# Patient Record
Sex: Female | Born: 1995 | Hispanic: Yes | Marital: Single | Smoking: Former smoker
Health system: Southern US, Community
[De-identification: ages and names within clinical notes are randomized; demographics above are authoritative.]

---
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
## 2019-07-16 ENCOUNTER — Ambulatory Visit: Payer: Self-pay

## 2019-08-15 ENCOUNTER — Other Ambulatory Visit: Payer: Self-pay

## 2019-08-15 DIAGNOSIS — Z20822 Contact with and (suspected) exposure to covid-19: Secondary | ICD-10-CM

## 2019-08-17 LAB — NOVEL CORONAVIRUS, NAA: SARS-CoV-2, NAA: NOT DETECTED

## 2019-08-18 ENCOUNTER — Ambulatory Visit (LOCAL_COMMUNITY_HEALTH_CENTER): Payer: Self-pay

## 2019-08-18 ENCOUNTER — Other Ambulatory Visit: Payer: Self-pay

## 2019-08-18 ENCOUNTER — Ambulatory Visit: Payer: Self-pay | Admitting: Advanced Practice Midwife

## 2019-08-18 DIAGNOSIS — F419 Anxiety disorder, unspecified: Secondary | ICD-10-CM

## 2019-08-18 DIAGNOSIS — Z62810 Personal history of physical and sexual abuse in childhood: Secondary | ICD-10-CM

## 2019-08-18 DIAGNOSIS — F325 Major depressive disorder, single episode, in full remission: Secondary | ICD-10-CM | POA: Insufficient documentation

## 2019-08-18 DIAGNOSIS — F4310 Post-traumatic stress disorder, unspecified: Secondary | ICD-10-CM

## 2019-08-18 DIAGNOSIS — F1290 Cannabis use, unspecified, uncomplicated: Secondary | ICD-10-CM

## 2019-08-18 DIAGNOSIS — Z113 Encounter for screening for infections with a predominantly sexual mode of transmission: Secondary | ICD-10-CM

## 2019-08-18 DIAGNOSIS — Z23 Encounter for immunization: Secondary | ICD-10-CM

## 2019-08-18 DIAGNOSIS — F1411 Cocaine abuse, in remission: Secondary | ICD-10-CM

## 2019-08-18 LAB — WET PREP FOR TRICH, YEAST, CLUE
Trichomonas Exam: NEGATIVE
Yeast Exam: NEGATIVE
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
## 2019-08-18 NOTE — Progress Notes (Signed)
Wet mount reviewed, no tx per standing order. Influenza vaccine administered. Provider orders completed.

## 2019-08-26 ENCOUNTER — Ambulatory Visit: Payer: Self-pay

## 2019-08-26 ENCOUNTER — Other Ambulatory Visit: Payer: Self-pay

## 2019-08-26 ENCOUNTER — Telehealth: Payer: Self-pay

## 2019-08-26 DIAGNOSIS — A749 Chlamydial infection, unspecified: Secondary | ICD-10-CM

## 2019-08-26 NOTE — Telephone Encounter (Signed)
TC to patient. Verified ID via password/SS#. Informed of positive Chlamydia and need for tx. Instructed to eat before visit and have partner call for tx appt. Appt scheduled. Zeno Hickel, RN   

## 2019-08-27 DIAGNOSIS — A749 Chlamydial infection, unspecified: Secondary | ICD-10-CM

## 2019-08-27 MED ORDER — AZITHROMYCIN 500 MG PO TABS
1000.0000 mg | ORAL_TABLET | Freq: Once | ORAL | Status: AC
  Administered 2019-08-27: 09:00:00 1000 mg via ORAL

## 2019-08-27 NOTE — Progress Notes (Signed)
Patient tx'd for Chlamydia per SO. Aileen Fass, RN

## 2019-09-25 ENCOUNTER — Ambulatory Visit: Payer: Self-pay | Admitting: Family Medicine

## 2019-09-25 ENCOUNTER — Other Ambulatory Visit: Payer: Self-pay

## 2019-09-25 ENCOUNTER — Encounter: Payer: Self-pay | Admitting: Family Medicine

## 2019-09-25 DIAGNOSIS — R300 Dysuria: Secondary | ICD-10-CM

## 2019-09-25 DIAGNOSIS — Z113 Encounter for screening for infections with a predominantly sexual mode of transmission: Secondary | ICD-10-CM

## 2019-09-25 LAB — WET PREP FOR TRICH, YEAST, CLUE
Trichomonas Exam: NEGATIVE
Yeast Exam: NEGATIVE

## 2019-09-25 NOTE — Progress Notes (Signed)
Wet mount reviewed and is negative today, so no treatment needed for wet mount per standing order. PCP list given to pt. Counseled pt per provider orders and pt states understanding. Provider orders completed.Ronny Bacon, RN
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
## 2019-09-25 NOTE — Progress Notes (Signed)
Pt here for STD screening today. Pt reports she feels like she has a UTI.Ronny Bacon, RN

## 2019-10-01 LAB — HEPATITIS B SURFACE ANTIGEN

## 2019-10-08 ENCOUNTER — Ambulatory Visit: Payer: Self-pay

## 2019-10-08 LAB — HM HEPATITIS C SCREENING LAB: HM Hepatitis Screen: NEGATIVE

## 2019-10-08 LAB — HM HIV SCREENING LAB: HM HIV Screening: NEGATIVE

## 2019-10-17 ENCOUNTER — Ambulatory Visit: Payer: Self-pay

## 2020-01-16 ENCOUNTER — Other Ambulatory Visit: Payer: Self-pay

## 2020-01-16 ENCOUNTER — Encounter: Payer: Self-pay | Admitting: Physician Assistant

## 2020-01-16 ENCOUNTER — Ambulatory Visit: Payer: Self-pay | Admitting: Physician Assistant

## 2020-01-16 DIAGNOSIS — Z1388 Encounter for screening for disorder due to exposure to contaminants: Secondary | ICD-10-CM | POA: Diagnosis not present

## 2020-01-16 DIAGNOSIS — Z113 Encounter for screening for infections with a predominantly sexual mode of transmission: Secondary | ICD-10-CM

## 2020-01-16 DIAGNOSIS — Z0389 Encounter for observation for other suspected diseases and conditions ruled out: Secondary | ICD-10-CM | POA: Diagnosis not present

## 2020-01-16 DIAGNOSIS — Z3009 Encounter for other general counseling and advice on contraception: Secondary | ICD-10-CM | POA: Diagnosis not present

## 2020-01-16 LAB — WET PREP FOR TRICH, YEAST, CLUE
Trichomonas Exam: NEGATIVE
Yeast Exam: NEGATIVE
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
## 2020-01-20 LAB — GONOCOCCUS CULTURE

## 2020-03-17 ENCOUNTER — Ambulatory Visit: Payer: Medicaid Other

## 2020-05-12 ENCOUNTER — Ambulatory Visit
Admission: EM | Admit: 2020-05-12 | Discharge: 2020-05-12 | Disposition: A | Discharge status: Other Acute Inpatient Hospital | Payer: Self-pay

## 2020-06-21 ENCOUNTER — Emergency Department

## 2020-06-21 ENCOUNTER — Other Ambulatory Visit: Payer: Self-pay

## 2020-06-21 ENCOUNTER — Emergency Department
Admission: EM | Admit: 2020-06-21 | Discharge: 2020-06-21 | Disposition: A | Discharge status: Home / Self Care | Attending: Emergency Medicine | Admitting: Emergency Medicine

## 2020-06-21 ENCOUNTER — Encounter: Payer: Self-pay | Admitting: Emergency Medicine

## 2020-06-21 DIAGNOSIS — Y92512 Supermarket, store or market as the place of occurrence of the external cause: Secondary | ICD-10-CM | POA: Diagnosis not present

## 2020-06-21 DIAGNOSIS — W010XXA Fall on same level from slipping, tripping and stumbling without subsequent striking against object, initial encounter: Secondary | ICD-10-CM | POA: Insufficient documentation

## 2020-06-21 DIAGNOSIS — Y990 Civilian activity done for income or pay: Secondary | ICD-10-CM | POA: Diagnosis not present

## 2020-06-21 DIAGNOSIS — S42432A Displaced fracture (avulsion) of lateral epicondyle of left humerus, initial encounter for closed fracture: Secondary | ICD-10-CM | POA: Diagnosis not present

## 2020-06-21 DIAGNOSIS — Y9389 Activity, other specified: Secondary | ICD-10-CM | POA: Insufficient documentation

## 2020-06-21 DIAGNOSIS — W19XXXA Unspecified fall, initial encounter: Secondary | ICD-10-CM

## 2020-06-21 DIAGNOSIS — S59902A Unspecified injury of left elbow, initial encounter: Secondary | ICD-10-CM | POA: Diagnosis present

## 2020-06-21 DIAGNOSIS — Y999 Unspecified external cause status: Secondary | ICD-10-CM | POA: Diagnosis not present

## 2020-06-21 DIAGNOSIS — S42402A Unspecified fracture of lower end of left humerus, initial encounter for closed fracture: Secondary | ICD-10-CM

## 2020-06-21 LAB — POCT PREGNANCY, URINE: Preg Test, Ur: NEGATIVE

## 2020-06-21 MED ORDER — FENTANYL CITRATE (PF) 100 MCG/2ML IJ SOLN: 50.0000 ug | Freq: Once | INTRAMUSCULAR | Status: DC

## 2020-06-21 MED ORDER — OXYCODONE-ACETAMINOPHEN 5-325 MG PO TABS
1.0000 | ORAL_TABLET | Freq: Once | ORAL | Status: AC
  Administered 2020-06-21: 1 via ORAL
  Filled 2020-06-21: qty 1

## 2020-06-21 MED ORDER — OXYCODONE HCL 5 MG PO TABS: 5.0000 mg | ORAL_TABLET | Freq: Four times a day (QID) | ORAL | 0 refills | Status: AC | PRN

## 2020-06-21 MED ORDER — ONDANSETRON HCL 4 MG/2ML IJ SOLN: 4.0000 mg | Freq: Once | INTRAMUSCULAR | Status: DC

## 2020-06-21 MED ORDER — ACETAMINOPHEN 325 MG PO TABS
650.0000 mg | ORAL_TABLET | Freq: Once | ORAL | Status: AC
  Administered 2020-06-21: 650 mg via ORAL
  Filled 2020-06-21: qty 2

## 2020-06-21 MED ORDER — ONDANSETRON 4 MG PO TBDP: 4.0000 mg | ORAL_TABLET | Freq: Three times a day (TID) | ORAL | 0 refills | Status: DC | PRN

## 2020-06-21 NOTE — ED Triage Notes (Signed)
Pt in via EMS from her place of employment. Pt went to put something on a pallet at work, tripped and fell forward and then fell backwards. Pt reports she has no feeling in her left arm or hand. Pt yells out in pain with any movement. 115/77, 80HR, 99%RA

## 2020-06-21 NOTE — ED Triage Notes (Signed)
Pt reports tripped at work and landed on her left arm hurting her left elbow.
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
## 2020-08-12 DIAGNOSIS — F32A Depression, unspecified: Secondary | ICD-10-CM | POA: Diagnosis not present

## 2020-08-12 DIAGNOSIS — Z3401 Encounter for supervision of normal first pregnancy, first trimester: Secondary | ICD-10-CM | POA: Diagnosis not present

## 2020-08-12 DIAGNOSIS — Z1389 Encounter for screening for other disorder: Secondary | ICD-10-CM | POA: Diagnosis not present

## 2020-08-30 DIAGNOSIS — O3680X0 Pregnancy with inconclusive fetal viability, not applicable or unspecified: Secondary | ICD-10-CM | POA: Diagnosis not present

## 2020-08-30 DIAGNOSIS — Z315 Encounter for genetic counseling: Secondary | ICD-10-CM | POA: Diagnosis not present

## 2020-08-30 DIAGNOSIS — Z3401 Encounter for supervision of normal first pregnancy, first trimester: Secondary | ICD-10-CM | POA: Diagnosis not present

## 2020-08-30 DIAGNOSIS — Z31430 Encounter of female for testing for genetic disease carrier status for procreative management: Secondary | ICD-10-CM | POA: Diagnosis not present

## 2020-08-30 DIAGNOSIS — Z3A13 13 weeks gestation of pregnancy: Secondary | ICD-10-CM | POA: Diagnosis not present

## 2020-08-31 DIAGNOSIS — F32A Depression, unspecified: Secondary | ICD-10-CM | POA: Diagnosis not present

## 2020-09-09 DIAGNOSIS — Z3401 Encounter for supervision of normal first pregnancy, first trimester: Secondary | ICD-10-CM | POA: Diagnosis not present

## 2020-09-09 DIAGNOSIS — A5600 Chlamydial infection of lower genitourinary tract, unspecified: Secondary | ICD-10-CM | POA: Diagnosis not present

## 2020-09-09 DIAGNOSIS — Z1389 Encounter for screening for other disorder: Secondary | ICD-10-CM | POA: Diagnosis not present

## 2020-10-15 DIAGNOSIS — Z1389 Encounter for screening for other disorder: Secondary | ICD-10-CM | POA: Diagnosis not present

## 2020-10-15 DIAGNOSIS — Z23 Encounter for immunization: Secondary | ICD-10-CM | POA: Diagnosis not present

## 2020-10-15 DIAGNOSIS — Z36 Encounter for antenatal screening for chromosomal anomalies: Secondary | ICD-10-CM | POA: Diagnosis not present

## 2020-10-15 DIAGNOSIS — Z363 Encounter for antenatal screening for malformations: Secondary | ICD-10-CM | POA: Diagnosis not present

## 2020-10-15 DIAGNOSIS — Z3A19 19 weeks gestation of pregnancy: Secondary | ICD-10-CM | POA: Diagnosis not present

## 2020-10-15 DIAGNOSIS — Z331 Pregnant state, incidental: Secondary | ICD-10-CM | POA: Diagnosis not present

## 2020-11-01 DIAGNOSIS — F419 Anxiety disorder, unspecified: Secondary | ICD-10-CM | POA: Diagnosis not present

## 2020-11-01 DIAGNOSIS — F4310 Post-traumatic stress disorder, unspecified: Secondary | ICD-10-CM | POA: Diagnosis not present

## 2020-11-01 DIAGNOSIS — F329 Major depressive disorder, single episode, unspecified: Secondary | ICD-10-CM | POA: Diagnosis not present

## 2020-11-01 DIAGNOSIS — O99340 Other mental disorders complicating pregnancy, unspecified trimester: Secondary | ICD-10-CM | POA: Diagnosis not present

## 2020-11-09 DIAGNOSIS — O99340 Other mental disorders complicating pregnancy, unspecified trimester: Secondary | ICD-10-CM | POA: Diagnosis not present

## 2020-11-09 DIAGNOSIS — F419 Anxiety disorder, unspecified: Secondary | ICD-10-CM | POA: Diagnosis not present

## 2020-11-09 DIAGNOSIS — F329 Major depressive disorder, single episode, unspecified: Secondary | ICD-10-CM | POA: Diagnosis not present

## 2020-11-09 DIAGNOSIS — F4310 Post-traumatic stress disorder, unspecified: Secondary | ICD-10-CM | POA: Diagnosis not present

## 2020-11-18 DIAGNOSIS — F4310 Post-traumatic stress disorder, unspecified: Secondary | ICD-10-CM | POA: Diagnosis not present

## 2020-11-18 DIAGNOSIS — F329 Major depressive disorder, single episode, unspecified: Secondary | ICD-10-CM | POA: Diagnosis not present

## 2020-11-18 DIAGNOSIS — O99340 Other mental disorders complicating pregnancy, unspecified trimester: Secondary | ICD-10-CM | POA: Diagnosis not present

## 2020-11-18 DIAGNOSIS — F419 Anxiety disorder, unspecified: Secondary | ICD-10-CM | POA: Diagnosis not present

## 2020-11-24 DIAGNOSIS — Z1389 Encounter for screening for other disorder: Secondary | ICD-10-CM | POA: Diagnosis not present

## 2020-11-24 DIAGNOSIS — Z23 Encounter for immunization: Secondary | ICD-10-CM | POA: Diagnosis not present

## 2020-11-24 DIAGNOSIS — Z3490 Encounter for supervision of normal pregnancy, unspecified, unspecified trimester: Secondary | ICD-10-CM | POA: Diagnosis not present

## 2020-11-25 DIAGNOSIS — F419 Anxiety disorder, unspecified: Secondary | ICD-10-CM | POA: Diagnosis not present

## 2020-11-25 DIAGNOSIS — F4310 Post-traumatic stress disorder, unspecified: Secondary | ICD-10-CM | POA: Diagnosis not present

## 2020-11-25 DIAGNOSIS — F329 Major depressive disorder, single episode, unspecified: Secondary | ICD-10-CM | POA: Diagnosis not present

## 2020-11-25 DIAGNOSIS — O99340 Other mental disorders complicating pregnancy, unspecified trimester: Secondary | ICD-10-CM | POA: Diagnosis not present

## 2020-11-29 IMAGING — CT CT ELBOW*L* W/O CM
3 series · 12 of 33 positions shown, 14 images · non-contrast
Comparison: June 21, 2020

CLINICAL DATA: Fall arm pain

EXAM:
CT OF THE UPPER LEFT EXTREMITY WITHOUT CONTRAST
TECHNIQUE: Multidetector CT imaging of the upper left extremity was performed
according to the standard protocol.

[Series 7: ax st · axial · 0.15mm/px · z∈[-779,-687]mm · 4 of 90 slices shown, 5 images]
[im 14/90  soft-tissue]
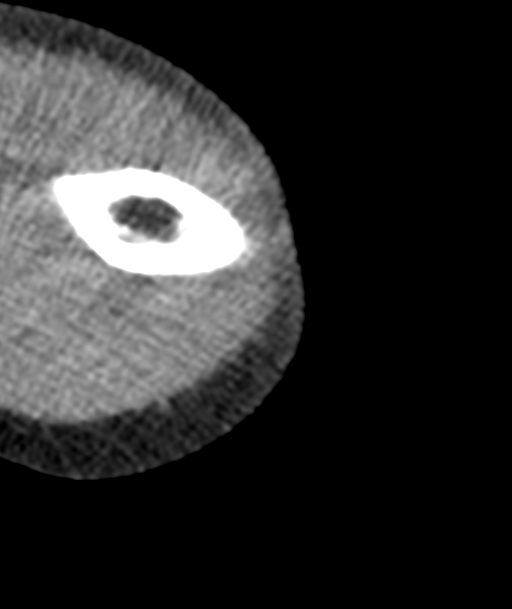
[im 14/90  bone]
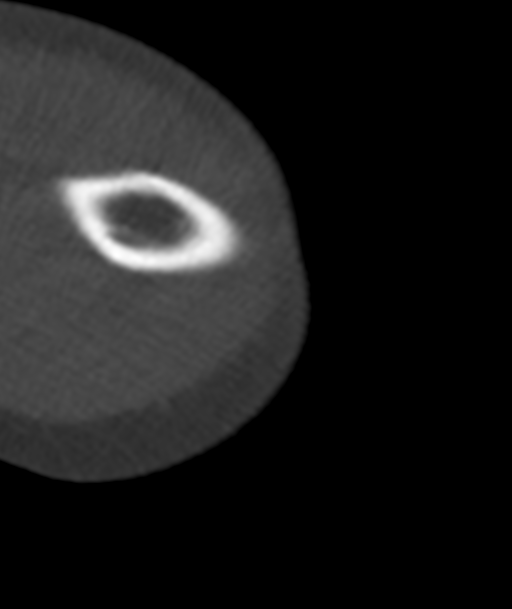
[im 35/90  bone]
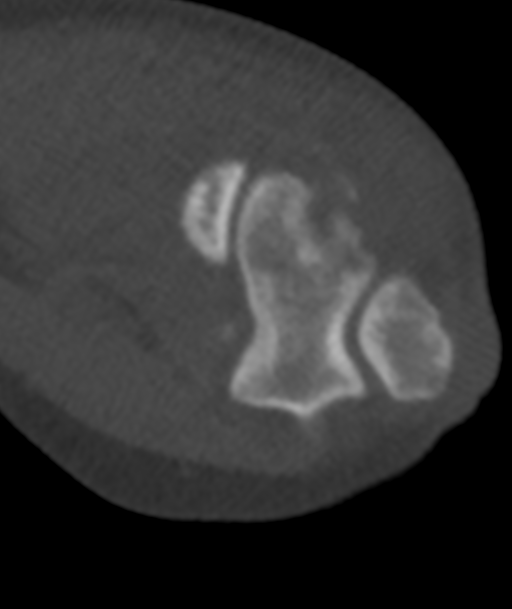
[im 55/90  bone]
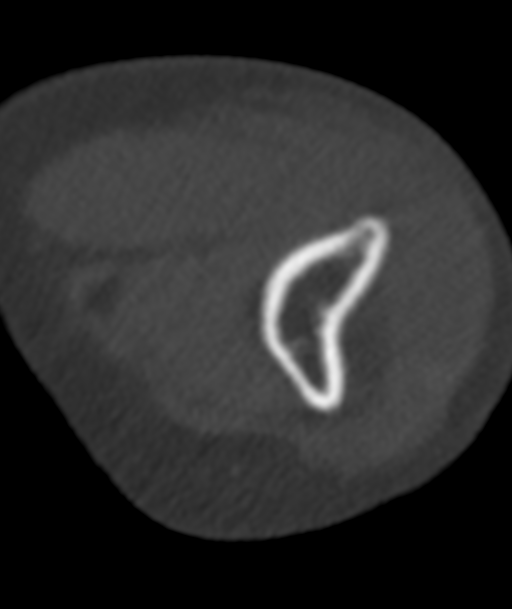
[im 76/90  bone]
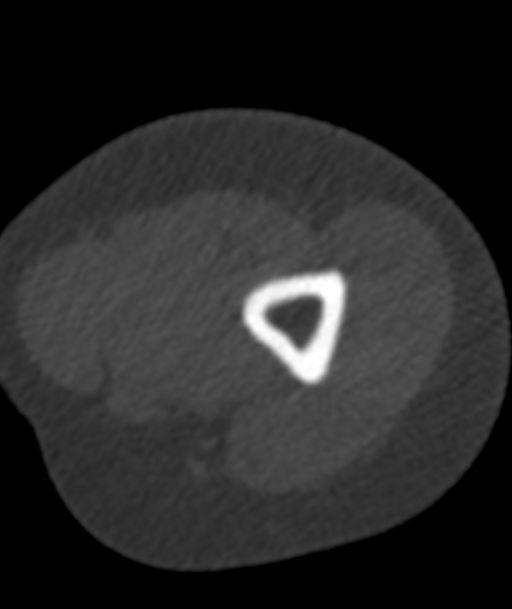

[Series 8: cor st · sagittal · 0.18mm/px · 5 of 53 slices shown, 6 images]
[im 18/53  bone]
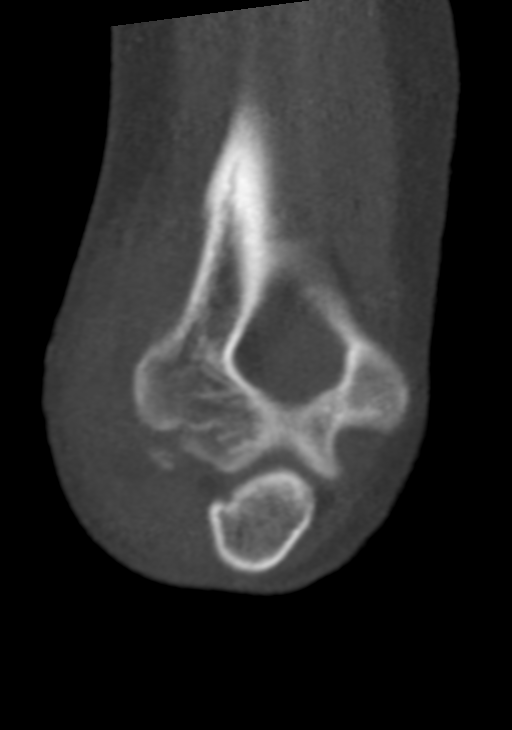
[im 22/53  bone]
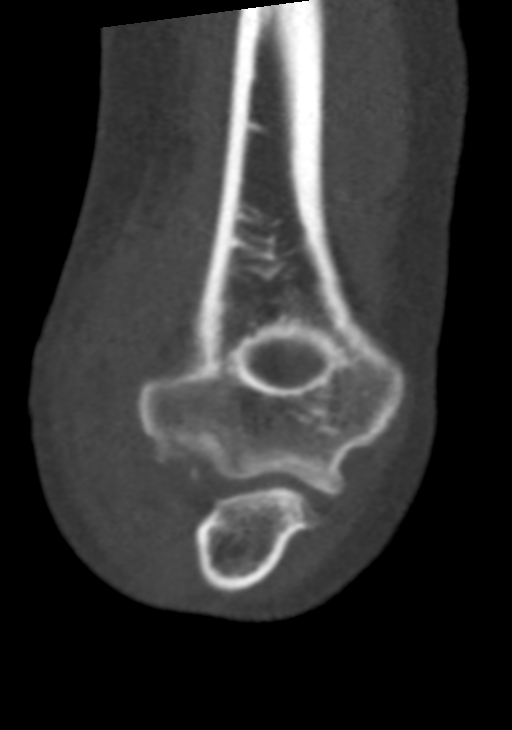
[im 27/53  soft-tissue]
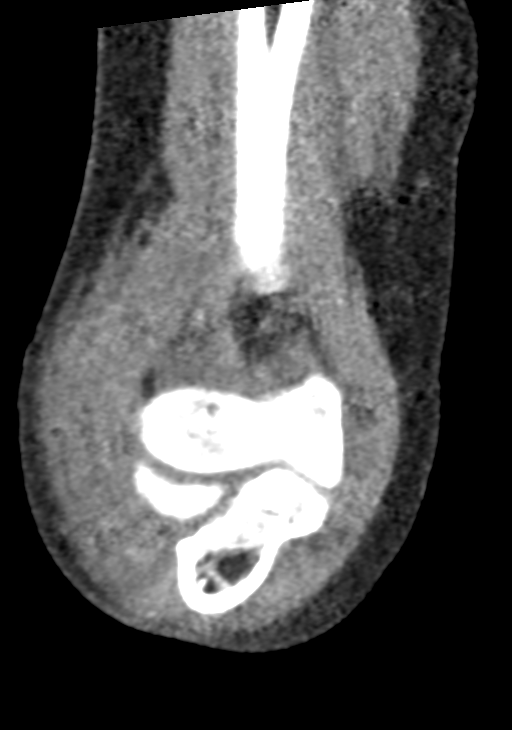
[im 27/53  bone]
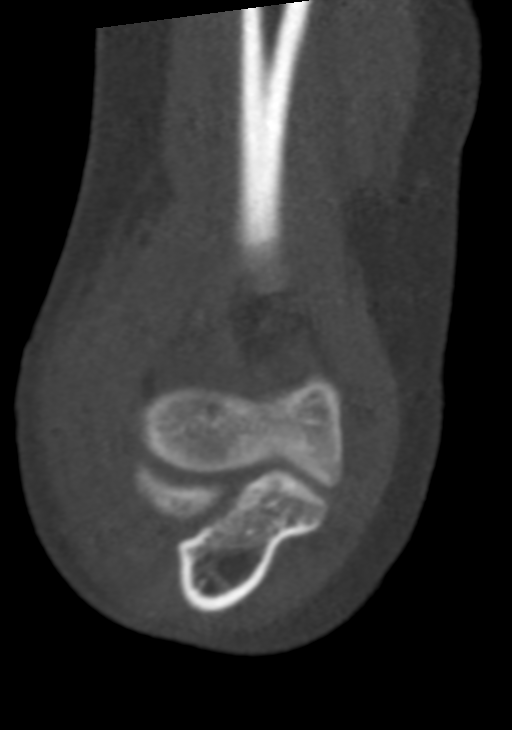
[im 31/53  bone]
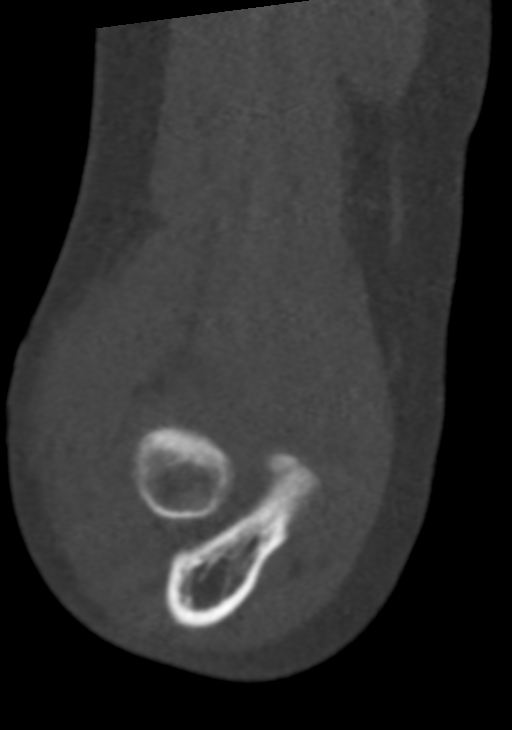
[im 35/53  bone]
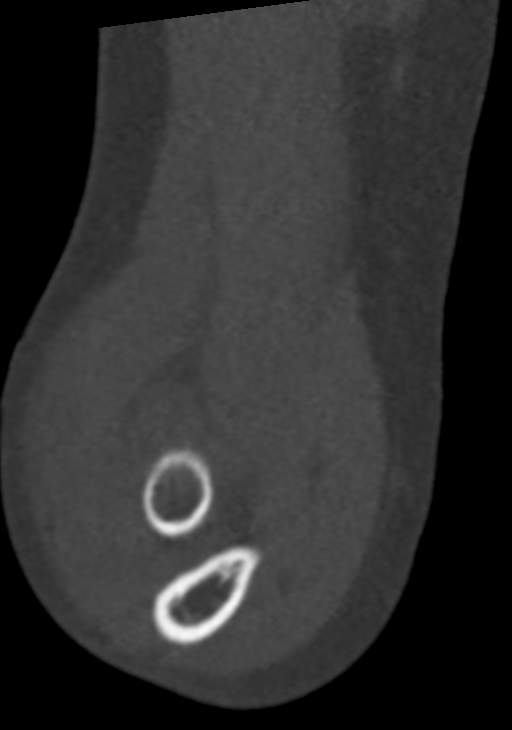

[Series 9: sag st · coronal · 0.15mm/px · 3 of 63 slices shown]
[im 13/63  bone]
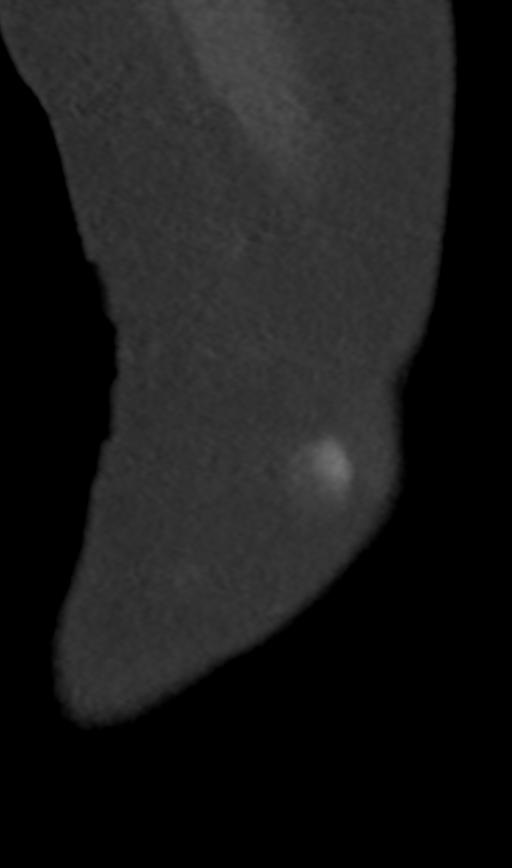
[im 25/63  bone]
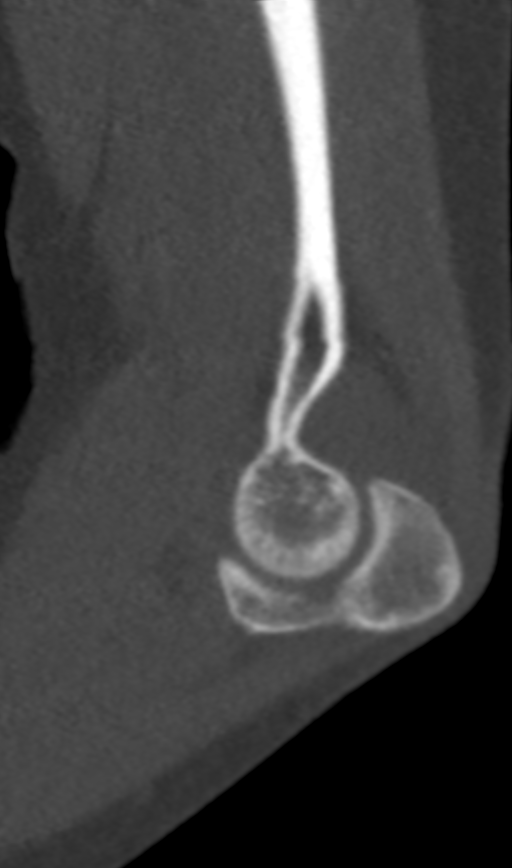
[im 38/63  bone]
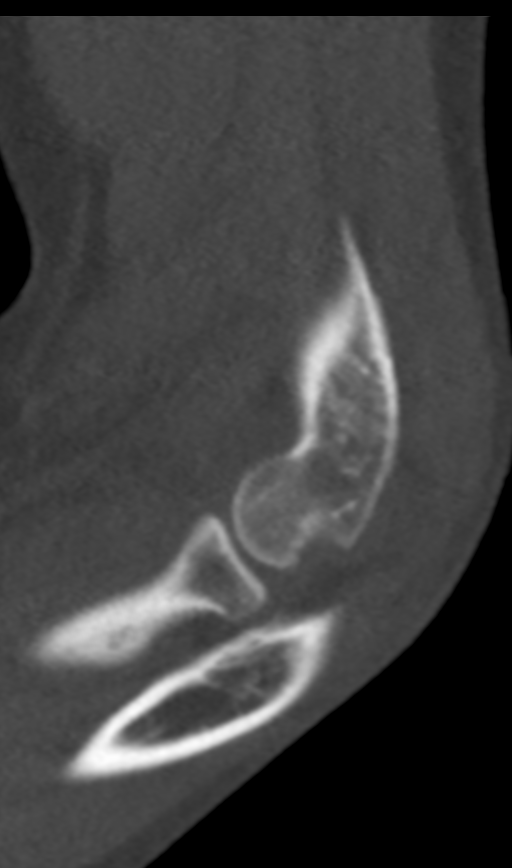

[12 of 33 positions shown; findings below may reference images not displayed]

FINDINGS: Bones/Joint/Cartilage

There is a slightly impacted nondisplaced fracture seen of the
posterior lateral epicondyle with small fracture fragment seen
posteriorly. There is also a minimally displaced anterior coronoid
process fracture. The articular surfaces appear to be maintained. No
dislocation is noted. A small elbow joint effusion is present.

Ligaments

Suboptimally assessed by CT.

Muscles and Tendons

The muscles surrounding the elbow appear to be grossly intact
without evidence focal atrophy or tear. Suboptimal visualization of
the tendon. There appears to be heterogeneous edema seen around the
common extensor tendon.

Soft tissues

Soft tissue swelling seen around the lateral aspect of the elbow.
IMPRESSION: Slightly impacted nondisplaced fracture of the posterior lateral
epicondyle with small fracture fragments.

Minimally displaced anterior coronoid process fracture.

Elbow joint effusion.

## 2020-11-29 IMAGING — CR DG ELBOW COMPLETE 3+V*L*
1 series · 4 of 4 positions shown · non-contrast
Comparison: None.

CLINICAL DATA: Fall, left arm pain

EXAM:
LEFT ELBOW - COMPLETE 3+ VIEW

[Series 1: dg elbow complete left (3+view) · 0.14mm/px · 4 of 4 slices shown]
[im 1/4]
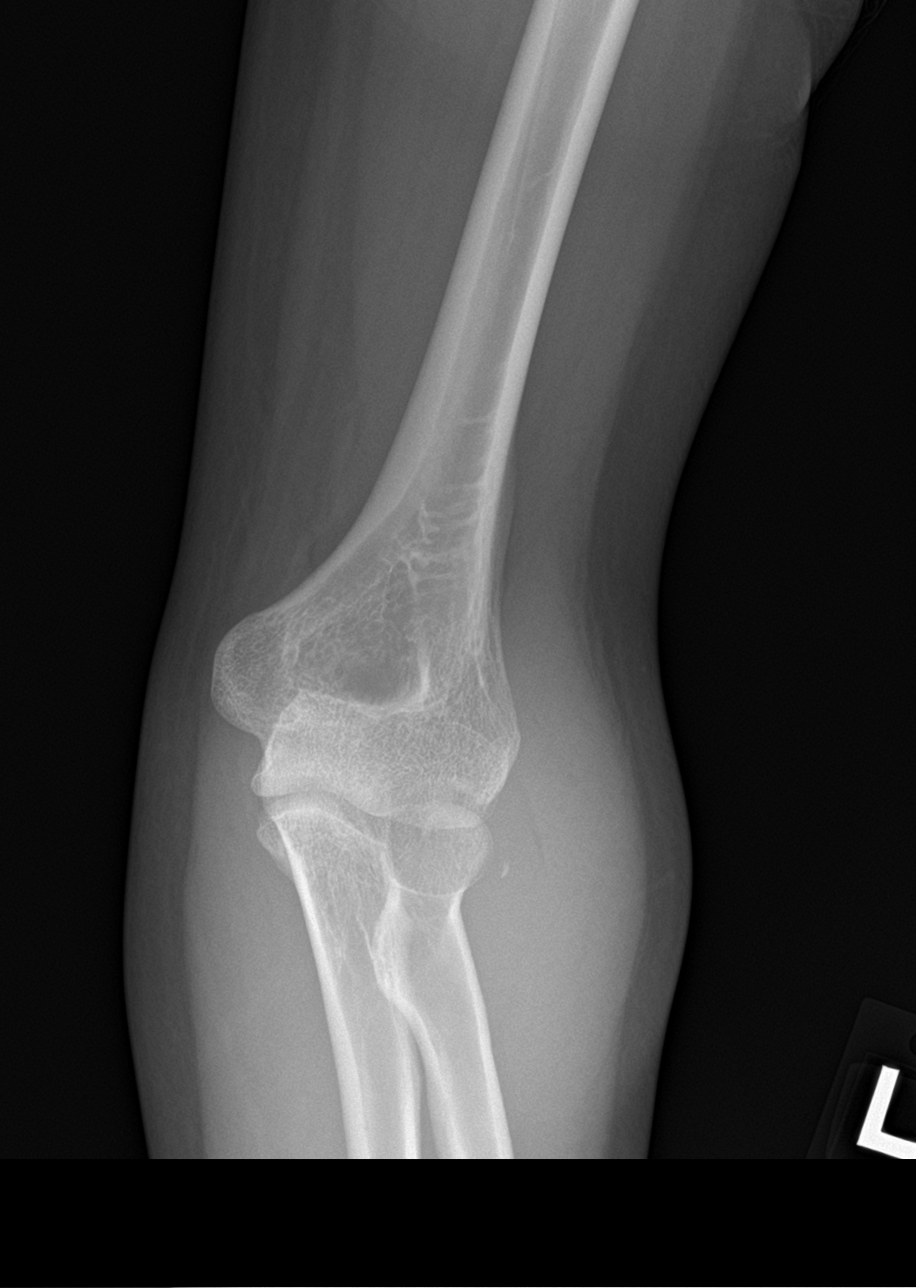
[im 2/4]
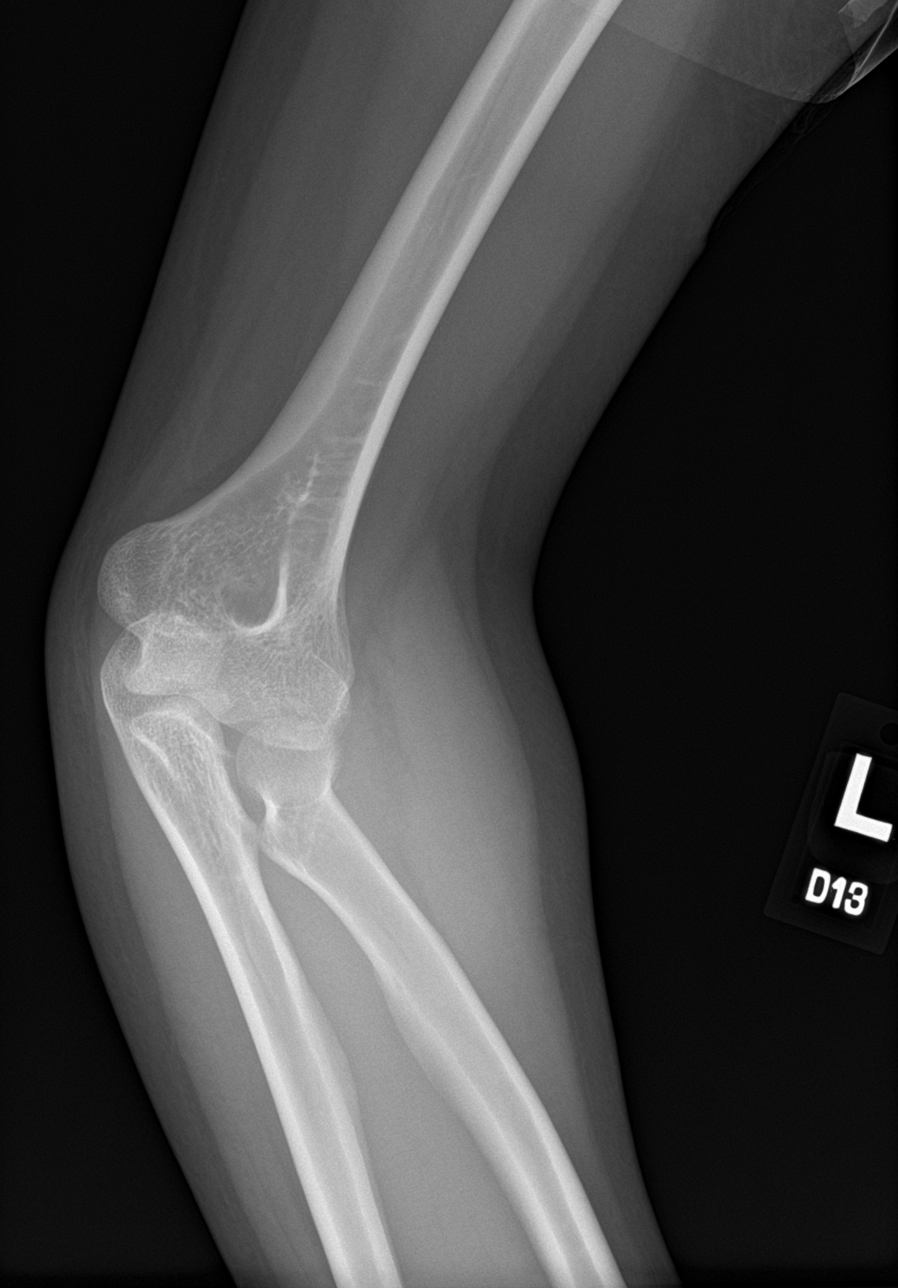
[im 3/4]
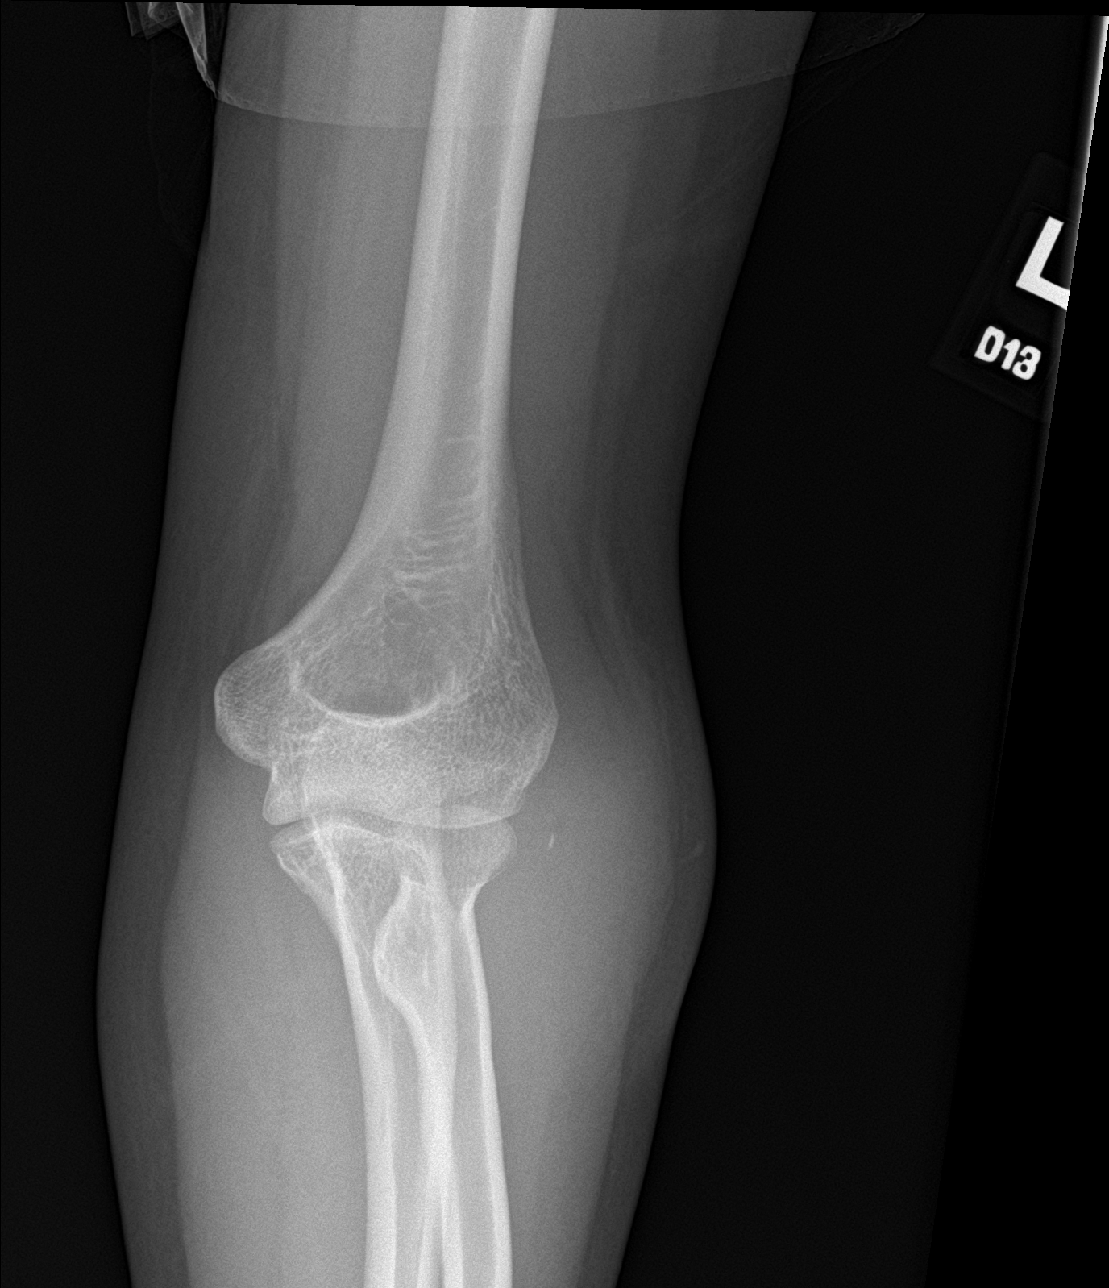
[im 4/4]
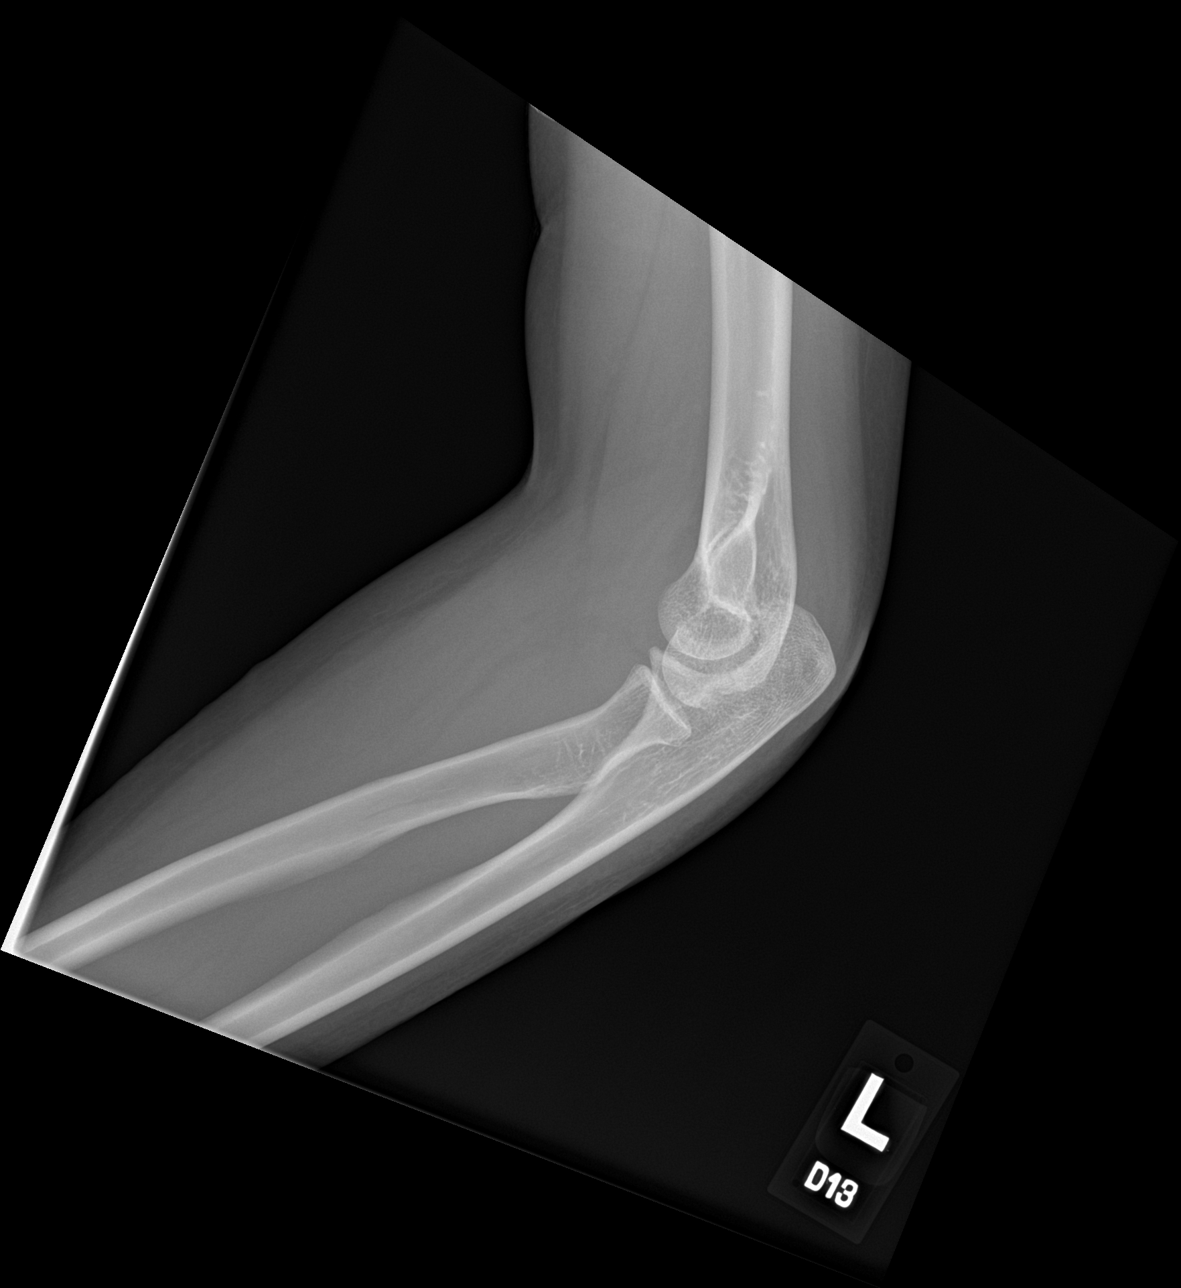

[4 of 4 positions shown; findings below may reference images not displayed]

FINDINGS: Four view radiograph left elbow is slightly limited by inability to
adequately flex the elbow for lateral examination. A left elbow
effusion, however, is suspected. A tiny ossific fracture fragment is
seen lateral to the radial head, however, the donor site is not
clearly identified. This may represent a fracture fragment of the
radial head or avulsive fracture involving the lateral epicondyle.
There is soft tissue swellingq superficial to the a lateral
epicondyle and radial head as well as infiltration of the
subcutaneous fat in this region in keeping with inflammatory change
or hemorrhage in this location. Normal alignment this limited
examination.
IMPRESSION: 1. Limited examination due to inability to adequately flex the elbow
for lateral examination.
2. A tiny ossific fracture fragment is seen lateral to the radial
head, however, the donor site is not clearly identified. This may
represent a fracture fragment of the radial head or avulsive
fracture involving the lateral epicondyle. This could be better
assessed with CT imaging.
3. Soft tissue swelling and infiltration of the subcutaneous fat in
keeping with inflammatory change or hemorrhage in this location.

## 2020-12-02 DIAGNOSIS — F329 Major depressive disorder, single episode, unspecified: Secondary | ICD-10-CM | POA: Diagnosis not present

## 2020-12-02 DIAGNOSIS — O99340 Other mental disorders complicating pregnancy, unspecified trimester: Secondary | ICD-10-CM | POA: Diagnosis not present

## 2020-12-02 DIAGNOSIS — F419 Anxiety disorder, unspecified: Secondary | ICD-10-CM | POA: Diagnosis not present

## 2020-12-02 DIAGNOSIS — F4310 Post-traumatic stress disorder, unspecified: Secondary | ICD-10-CM | POA: Diagnosis not present

## 2020-12-17 DIAGNOSIS — F4310 Post-traumatic stress disorder, unspecified: Secondary | ICD-10-CM | POA: Diagnosis not present

## 2020-12-17 DIAGNOSIS — F329 Major depressive disorder, single episode, unspecified: Secondary | ICD-10-CM | POA: Diagnosis not present

## 2020-12-17 DIAGNOSIS — O99340 Other mental disorders complicating pregnancy, unspecified trimester: Secondary | ICD-10-CM | POA: Diagnosis not present

## 2020-12-17 DIAGNOSIS — F419 Anxiety disorder, unspecified: Secondary | ICD-10-CM | POA: Diagnosis not present

## 2020-12-22 DIAGNOSIS — Z113 Encounter for screening for infections with a predominantly sexual mode of transmission: Secondary | ICD-10-CM | POA: Diagnosis not present

## 2020-12-22 DIAGNOSIS — Z23 Encounter for immunization: Secondary | ICD-10-CM | POA: Diagnosis not present

## 2020-12-22 DIAGNOSIS — Z1389 Encounter for screening for other disorder: Secondary | ICD-10-CM | POA: Diagnosis not present

## 2020-12-22 DIAGNOSIS — Z3490 Encounter for supervision of normal pregnancy, unspecified, unspecified trimester: Secondary | ICD-10-CM | POA: Diagnosis not present

## 2021-01-06 DIAGNOSIS — Z3490 Encounter for supervision of normal pregnancy, unspecified, unspecified trimester: Secondary | ICD-10-CM | POA: Diagnosis not present

## 2021-01-20 DIAGNOSIS — Z130 Encounter for screening for diseases of the blood and blood-forming organs and certain disorders involving the immune mechanism: Secondary | ICD-10-CM | POA: Diagnosis not present

## 2021-01-20 DIAGNOSIS — Z3490 Encounter for supervision of normal pregnancy, unspecified, unspecified trimester: Secondary | ICD-10-CM | POA: Diagnosis not present

## 2021-01-20 DIAGNOSIS — Z1389 Encounter for screening for other disorder: Secondary | ICD-10-CM | POA: Diagnosis not present

## 2021-02-03 DIAGNOSIS — Z3490 Encounter for supervision of normal pregnancy, unspecified, unspecified trimester: Secondary | ICD-10-CM | POA: Diagnosis not present

## 2021-02-11 DIAGNOSIS — Z3490 Encounter for supervision of normal pregnancy, unspecified, unspecified trimester: Secondary | ICD-10-CM | POA: Diagnosis not present

## 2021-02-16 DIAGNOSIS — Z3490 Encounter for supervision of normal pregnancy, unspecified, unspecified trimester: Secondary | ICD-10-CM | POA: Diagnosis not present

## 2021-02-16 DIAGNOSIS — Z1389 Encounter for screening for other disorder: Secondary | ICD-10-CM | POA: Diagnosis not present

## 2021-02-24 DIAGNOSIS — Z3490 Encounter for supervision of normal pregnancy, unspecified, unspecified trimester: Secondary | ICD-10-CM | POA: Diagnosis not present

## 2021-03-03 DIAGNOSIS — Z3490 Encounter for supervision of normal pregnancy, unspecified, unspecified trimester: Secondary | ICD-10-CM | POA: Diagnosis not present

## 2021-03-10 DIAGNOSIS — Z3A40 40 weeks gestation of pregnancy: Secondary | ICD-10-CM | POA: Diagnosis not present

## 2021-03-11 DIAGNOSIS — Z20822 Contact with and (suspected) exposure to covid-19: Secondary | ICD-10-CM | POA: Diagnosis not present

## 2021-03-11 DIAGNOSIS — O99214 Obesity complicating childbirth: Secondary | ICD-10-CM | POA: Diagnosis not present

## 2021-03-11 DIAGNOSIS — Z3A40 40 weeks gestation of pregnancy: Secondary | ICD-10-CM | POA: Diagnosis not present

## 2021-03-11 DIAGNOSIS — O480 Post-term pregnancy: Secondary | ICD-10-CM | POA: Diagnosis not present

## 2021-03-11 DIAGNOSIS — Z87891 Personal history of nicotine dependence: Secondary | ICD-10-CM | POA: Diagnosis not present

## 2021-03-11 DIAGNOSIS — Z79899 Other long term (current) drug therapy: Secondary | ICD-10-CM | POA: Diagnosis not present

## 2021-03-12 DIAGNOSIS — O480 Post-term pregnancy: Secondary | ICD-10-CM | POA: Diagnosis not present

## 2021-03-12 DIAGNOSIS — Z3A40 40 weeks gestation of pregnancy: Secondary | ICD-10-CM | POA: Diagnosis not present

## 2021-03-24 DIAGNOSIS — Z1389 Encounter for screening for other disorder: Secondary | ICD-10-CM | POA: Diagnosis not present

## 2021-03-24 DIAGNOSIS — Z130 Encounter for screening for diseases of the blood and blood-forming organs and certain disorders involving the immune mechanism: Secondary | ICD-10-CM | POA: Diagnosis not present

## 2021-04-26 DIAGNOSIS — Z712 Person consulting for explanation of examination or test findings: Secondary | ICD-10-CM | POA: Diagnosis not present

## 2021-04-26 DIAGNOSIS — Z1389 Encounter for screening for other disorder: Secondary | ICD-10-CM | POA: Diagnosis not present

## 2021-05-08 DIAGNOSIS — Z20822 Contact with and (suspected) exposure to covid-19: Secondary | ICD-10-CM | POA: Diagnosis not present

## 2021-05-20 DIAGNOSIS — Z113 Encounter for screening for infections with a predominantly sexual mode of transmission: Secondary | ICD-10-CM | POA: Diagnosis not present

## 2021-05-20 DIAGNOSIS — Z0000 Encounter for general adult medical examination without abnormal findings: Secondary | ICD-10-CM | POA: Diagnosis not present

## 2021-06-02 DIAGNOSIS — Z23 Encounter for immunization: Secondary | ICD-10-CM | POA: Diagnosis not present

## 2021-08-04 DIAGNOSIS — Z113 Encounter for screening for infections with a predominantly sexual mode of transmission: Secondary | ICD-10-CM | POA: Diagnosis not present

## 2021-08-04 DIAGNOSIS — Z0000 Encounter for general adult medical examination without abnormal findings: Secondary | ICD-10-CM | POA: Diagnosis not present

## 2021-10-07 ENCOUNTER — Encounter: Payer: Self-pay | Admitting: Advanced Practice Midwife

## 2021-10-07 ENCOUNTER — Other Ambulatory Visit: Payer: Self-pay

## 2021-10-07 ENCOUNTER — Ambulatory Visit: Payer: Medicaid Other | Admitting: Advanced Practice Midwife

## 2021-10-07 DIAGNOSIS — Z113 Encounter for screening for infections with a predominantly sexual mode of transmission: Secondary | ICD-10-CM | POA: Diagnosis not present

## 2021-10-07 LAB — WET PREP FOR TRICH, YEAST, CLUE: Trichomonas Exam: NEGATIVE

## 2021-10-07 NOTE — Progress Notes (Signed)
Pt here for STD screening.  Wet mount results reviewed, no treatment required, per Provider.  Berdie Ogren, RN
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
## 2021-11-23 DIAGNOSIS — R109 Unspecified abdominal pain: Secondary | ICD-10-CM | POA: Diagnosis not present

## 2021-11-23 DIAGNOSIS — R142 Eructation: Secondary | ICD-10-CM | POA: Diagnosis not present

## 2021-11-23 DIAGNOSIS — R197 Diarrhea, unspecified: Secondary | ICD-10-CM | POA: Diagnosis not present

## 2021-11-23 DIAGNOSIS — R1110 Vomiting, unspecified: Secondary | ICD-10-CM | POA: Diagnosis not present

## 2021-12-28 DIAGNOSIS — L723 Sebaceous cyst: Secondary | ICD-10-CM | POA: Diagnosis not present

## 2022-03-12 DIAGNOSIS — H109 Unspecified conjunctivitis: Secondary | ICD-10-CM | POA: Diagnosis not present

## 2022-03-13 DIAGNOSIS — H0100B Unspecified blepharitis left eye, upper and lower eyelids: Secondary | ICD-10-CM | POA: Diagnosis not present

## 2022-03-13 DIAGNOSIS — H019 Unspecified inflammation of eyelid: Secondary | ICD-10-CM | POA: Diagnosis not present

## 2022-03-13 DIAGNOSIS — H0100A Unspecified blepharitis right eye, upper and lower eyelids: Secondary | ICD-10-CM | POA: Diagnosis not present

## 2022-05-24 DIAGNOSIS — Z113 Encounter for screening for infections with a predominantly sexual mode of transmission: Secondary | ICD-10-CM | POA: Diagnosis not present

## 2022-05-24 DIAGNOSIS — Z1331 Encounter for screening for depression: Secondary | ICD-10-CM | POA: Diagnosis not present

## 2022-05-24 DIAGNOSIS — Z1389 Encounter for screening for other disorder: Secondary | ICD-10-CM | POA: Diagnosis not present

## 2022-06-15 DIAGNOSIS — R059 Cough, unspecified: Secondary | ICD-10-CM | POA: Diagnosis not present

## 2022-06-26 DIAGNOSIS — H00022 Hordeolum internum right lower eyelid: Secondary | ICD-10-CM | POA: Diagnosis not present

## 2022-11-13 ENCOUNTER — Ambulatory Visit
Admission: EM | Admit: 2022-11-13 | Discharge: 2022-11-13 | Disposition: A | Discharge status: Home / Self Care | Payer: No Typology Code available for payment source | Attending: Family Medicine | Admitting: Family Medicine

## 2022-11-13 ENCOUNTER — Ambulatory Visit (INDEPENDENT_AMBULATORY_CARE_PROVIDER_SITE_OTHER): Payer: No Typology Code available for payment source

## 2022-11-13 DIAGNOSIS — S93401A Sprain of unspecified ligament of right ankle, initial encounter: Secondary | ICD-10-CM

## 2022-11-13 DIAGNOSIS — M25571 Pain in right ankle and joints of right foot: Secondary | ICD-10-CM | POA: Diagnosis not present

## 2022-11-13 DIAGNOSIS — M79671 Pain in right foot: Secondary | ICD-10-CM

## 2022-11-13 MED ORDER — NAPROXEN 500 MG PO TABS: 500.0000 mg | ORAL_TABLET | Freq: Two times a day (BID) | ORAL | 0 refills | Status: AC
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
## 2022-11-13 NOTE — ED Triage Notes (Signed)
Pt states she tripped while wearing big heels, pt fell and injured RT foot DOI: 11/13/22

## 2022-11-13 NOTE — Discharge Instructions (Addendum)
If medication was prescribed, stop by the pharmacy to pick up your prescriptions.  For your  pain, Take 1500 mg Tylenol twice a day,  take Naprosyn twice a day,  as needed for pain. Apply a compressive ACE bandage or wear your ankle brace. Rest and elevate the affected painful area.  Apply cold compresses intermittently, as needed.  As pain recedes, begin normal activities slowly as tolerated.  Follow up with primary care provider or an orthopedic provider, if symptoms persist.  Watch for worsening symptoms such as an increasing weakness or loss of sensation, increasing pain and/or the loss of bladder or bowel function. Should any of these occur, go to the emergency department immediately.

## 2023-03-13 ENCOUNTER — Encounter: Payer: Self-pay | Admitting: *Deleted

## 2023-03-13 ENCOUNTER — Ambulatory Visit
Admission: EM | Admit: 2023-03-13 | Discharge: 2023-03-13 | Disposition: A | Discharge status: Home / Self Care | Payer: No Typology Code available for payment source

## 2023-03-13 DIAGNOSIS — K5900 Constipation, unspecified: Secondary | ICD-10-CM
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
## 2023-03-13 NOTE — ED Triage Notes (Signed)
Pt states she has constipation and she has had BRB in her stools x few days.   She is worried because her mom currently has colon cancer.

## 2023-03-13 NOTE — Discharge Instructions (Addendum)
Avoid spicy,greasy foods, please follow up with PCP/GI specialist. Avoid straining:try over the counter stool softener, miralax for constipation. Drink plenty of water, avoid caffeine. Go to Er for worsening issues.

## 2023-12-02 ENCOUNTER — Ambulatory Visit
Admission: EM | Admit: 2023-12-02 | Discharge: 2023-12-02 | Disposition: A | Discharge status: Home / Self Care | Payer: BC Managed Care – PPO | Attending: Emergency Medicine | Admitting: Emergency Medicine

## 2023-12-02 ENCOUNTER — Encounter: Payer: Self-pay | Admitting: Emergency Medicine

## 2023-12-02 DIAGNOSIS — J069 Acute upper respiratory infection, unspecified: Secondary | ICD-10-CM

## 2023-12-02 MED ORDER — PROMETHAZINE-DM 6.25-15 MG/5ML PO SYRP: 5.0000 mL | ORAL_SOLUTION | Freq: Four times a day (QID) | ORAL | 0 refills | Status: AC | PRN

## 2023-12-02 MED ORDER — IPRATROPIUM BROMIDE 0.06 % NA SOLN: 2.0000 | Freq: Four times a day (QID) | NASAL | 12 refills | Status: AC

## 2023-12-02 MED ORDER — AMOXICILLIN-POT CLAVULANATE 875-125 MG PO TABS: 1.0000 | ORAL_TABLET | Freq: Two times a day (BID) | ORAL | 0 refills | Status: AC

## 2023-12-02 MED ORDER — BENZONATATE 100 MG PO CAPS: 200.0000 mg | ORAL_CAPSULE | Freq: Three times a day (TID) | ORAL | 0 refills | Status: AC
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
## 2023-12-02 NOTE — ED Triage Notes (Signed)
 Patient c/o headaches, bodyaches, nasal congestion and chills that started a week ago.  Patient reports cough for 2 weeks.

## 2023-12-02 NOTE — Discharge Instructions (Addendum)
 You have been diagnosed with an upper respiratory infection.  Please take the Augmentin agers 875 mg twice daily with food for 7 days for treatment of your upper respiratory infection.  Use over-the-counter Tylenol and/or ibuprofen according the package instructions as needed for any fever or pain.  Use the Atrovent nasal spray, 2 squirts in each nostril every 6 hours, as needed for runny nose and postnasal drip.  Use the Tessalon Perles every 8 hours during the day.  Take them with a small sip of water.  They may give you some numbness to the base of your tongue or a metallic taste in your mouth, this is normal.  Use the Promethazine DM cough syrup at bedtime for cough and congestion.  It will make you drowsy so do not take it during the day.  Return for reevaluation or see your primary care provider for any new or worsening symptoms.
